# Patient Record
Sex: Female | Born: 1960 | Race: Black or African American | Hispanic: No | Smoking: Never smoker
Health system: Southern US, Community
[De-identification: ages and names within clinical notes are randomized; demographics above are authoritative.]

## PROBLEM LIST (undated history)

## (undated) DIAGNOSIS — I639 Cerebral infarction, unspecified: Secondary | ICD-10-CM

## (undated) DIAGNOSIS — M1990 Unspecified osteoarthritis, unspecified site: Secondary | ICD-10-CM

## (undated) DIAGNOSIS — M7120 Synovial cyst of popliteal space [Baker], unspecified knee: Secondary | ICD-10-CM

## (undated) DIAGNOSIS — E119 Type 2 diabetes mellitus without complications: Secondary | ICD-10-CM

## (undated) DIAGNOSIS — G253 Myoclonus: Secondary | ICD-10-CM

## (undated) DIAGNOSIS — G9050 Complex regional pain syndrome I, unspecified: Secondary | ICD-10-CM

## (undated) HISTORY — DX: Unspecified osteoarthritis, unspecified site: M19.90

## (undated) HISTORY — PX: FOOT SURGERY: SHX648

## (undated) HISTORY — PX: ANKLE SURGERY: SHX546

## (undated) HISTORY — DX: Type 2 diabetes mellitus without complications: E11.9

## (undated) HISTORY — DX: Myoclonus: G25.3

## (undated) HISTORY — DX: Synovial cyst of popliteal space (Baker), unspecified knee: M71.20

## (undated) HISTORY — PX: KNEE SURGERY: SHX244

---

## 2008-10-07 ENCOUNTER — Ambulatory Visit (HOSPITAL_COMMUNITY): Payer: Self-pay | Admitting: Psychiatry

## 2008-11-07 ENCOUNTER — Ambulatory Visit (HOSPITAL_COMMUNITY): Payer: Self-pay | Admitting: Psychiatry

## 2008-11-15 ENCOUNTER — Ambulatory Visit (HOSPITAL_COMMUNITY): Payer: Self-pay | Admitting: Psychiatry

## 2008-11-22 ENCOUNTER — Ambulatory Visit (HOSPITAL_COMMUNITY): Payer: Self-pay | Admitting: Psychiatry

## 2008-12-01 ENCOUNTER — Ambulatory Visit (HOSPITAL_COMMUNITY): Payer: Self-pay | Admitting: Psychiatry

## 2008-12-08 ENCOUNTER — Ambulatory Visit (HOSPITAL_COMMUNITY): Payer: Self-pay | Admitting: Psychiatry

## 2008-12-13 ENCOUNTER — Ambulatory Visit (HOSPITAL_COMMUNITY): Payer: Self-pay | Admitting: Psychiatry

## 2008-12-19 ENCOUNTER — Ambulatory Visit (HOSPITAL_COMMUNITY): Payer: Self-pay | Admitting: Psychiatry

## 2009-01-09 ENCOUNTER — Ambulatory Visit (HOSPITAL_COMMUNITY): Payer: Self-pay | Admitting: Psychiatry

## 2009-01-10 ENCOUNTER — Ambulatory Visit (HOSPITAL_COMMUNITY): Payer: Self-pay | Admitting: Psychiatry

## 2009-01-16 ENCOUNTER — Ambulatory Visit (HOSPITAL_COMMUNITY): Payer: Self-pay | Admitting: Psychiatry

## 2009-01-26 ENCOUNTER — Ambulatory Visit (HOSPITAL_COMMUNITY): Payer: Self-pay | Admitting: Psychiatry

## 2009-02-08 ENCOUNTER — Ambulatory Visit (HOSPITAL_COMMUNITY): Payer: Self-pay | Admitting: Psychiatry

## 2009-02-14 ENCOUNTER — Ambulatory Visit (HOSPITAL_COMMUNITY): Payer: Self-pay | Admitting: Psychiatry

## 2009-03-01 ENCOUNTER — Ambulatory Visit (HOSPITAL_COMMUNITY): Payer: Self-pay | Admitting: Psychiatry

## 2009-03-08 ENCOUNTER — Ambulatory Visit (HOSPITAL_COMMUNITY): Payer: Self-pay | Admitting: Psychiatry

## 2009-03-17 ENCOUNTER — Ambulatory Visit (HOSPITAL_COMMUNITY): Payer: Self-pay | Admitting: Psychiatry

## 2009-03-29 ENCOUNTER — Ambulatory Visit (HOSPITAL_COMMUNITY): Payer: Self-pay | Admitting: Psychiatry

## 2009-04-11 ENCOUNTER — Ambulatory Visit (HOSPITAL_COMMUNITY): Payer: Self-pay | Admitting: Psychiatry

## 2009-04-27 ENCOUNTER — Ambulatory Visit (HOSPITAL_COMMUNITY): Payer: Self-pay | Admitting: Psychiatry

## 2009-05-03 ENCOUNTER — Ambulatory Visit (HOSPITAL_COMMUNITY): Payer: Self-pay | Admitting: Psychiatry

## 2009-05-09 ENCOUNTER — Ambulatory Visit (HOSPITAL_COMMUNITY): Payer: Self-pay | Admitting: Psychiatry

## 2009-05-10 ENCOUNTER — Ambulatory Visit (HOSPITAL_COMMUNITY): Payer: Self-pay | Admitting: Psychiatry

## 2009-06-05 ENCOUNTER — Ambulatory Visit (HOSPITAL_COMMUNITY): Payer: Self-pay | Admitting: Psychiatry

## 2009-08-18 ENCOUNTER — Ambulatory Visit (HOSPITAL_COMMUNITY): Payer: Self-pay | Admitting: Licensed Clinical Social Worker

## 2009-08-28 ENCOUNTER — Encounter: Admission: RE | Admit: 2009-08-28 | Discharge: 2009-08-28 | Payer: Self-pay | Admitting: Internal Medicine

## 2009-08-31 ENCOUNTER — Ambulatory Visit (HOSPITAL_COMMUNITY): Payer: Self-pay | Admitting: Licensed Clinical Social Worker

## 2009-09-11 ENCOUNTER — Ambulatory Visit (HOSPITAL_COMMUNITY): Payer: Self-pay | Admitting: Psychiatry

## 2009-09-18 ENCOUNTER — Ambulatory Visit (HOSPITAL_COMMUNITY): Payer: Self-pay | Admitting: Licensed Clinical Social Worker

## 2009-10-24 ENCOUNTER — Ambulatory Visit (HOSPITAL_COMMUNITY): Payer: Self-pay | Admitting: Licensed Clinical Social Worker

## 2009-12-04 ENCOUNTER — Ambulatory Visit (HOSPITAL_COMMUNITY): Payer: Self-pay | Admitting: Psychiatry

## 2009-12-06 ENCOUNTER — Ambulatory Visit (HOSPITAL_COMMUNITY): Payer: Self-pay | Admitting: Licensed Clinical Social Worker

## 2009-12-26 ENCOUNTER — Ambulatory Visit (HOSPITAL_COMMUNITY): Payer: Self-pay | Admitting: Licensed Clinical Social Worker

## 2010-01-09 ENCOUNTER — Ambulatory Visit (HOSPITAL_COMMUNITY): Payer: Self-pay | Admitting: Licensed Clinical Social Worker

## 2010-01-26 ENCOUNTER — Ambulatory Visit (HOSPITAL_COMMUNITY): Payer: Self-pay | Admitting: Licensed Clinical Social Worker

## 2010-03-06 ENCOUNTER — Ambulatory Visit (HOSPITAL_COMMUNITY): Payer: Self-pay | Admitting: Psychiatry

## 2010-03-08 ENCOUNTER — Ambulatory Visit (HOSPITAL_COMMUNITY): Payer: Self-pay | Admitting: Licensed Clinical Social Worker

## 2010-03-20 ENCOUNTER — Ambulatory Visit (HOSPITAL_COMMUNITY): Payer: Self-pay | Admitting: Licensed Clinical Social Worker

## 2010-04-26 ENCOUNTER — Ambulatory Visit (HOSPITAL_COMMUNITY): Payer: Self-pay | Admitting: Licensed Clinical Social Worker

## 2010-05-08 ENCOUNTER — Ambulatory Visit (HOSPITAL_COMMUNITY): Payer: Self-pay | Admitting: Psychiatry

## 2010-05-10 ENCOUNTER — Ambulatory Visit (HOSPITAL_COMMUNITY): Payer: Self-pay | Admitting: Licensed Clinical Social Worker

## 2010-05-24 ENCOUNTER — Ambulatory Visit (HOSPITAL_COMMUNITY)
Admission: RE | Admit: 2010-05-24 | Discharge: 2010-05-24 | Payer: Self-pay | Source: Home / Self Care | Attending: Licensed Clinical Social Worker | Admitting: Licensed Clinical Social Worker

## 2010-06-07 ENCOUNTER — Ambulatory Visit (HOSPITAL_COMMUNITY): Admit: 2010-06-07 | Payer: Self-pay | Admitting: Licensed Clinical Social Worker

## 2010-07-10 ENCOUNTER — Encounter (HOSPITAL_COMMUNITY): Payer: Self-pay | Admitting: Psychiatry

## 2010-07-24 ENCOUNTER — Encounter (INDEPENDENT_AMBULATORY_CARE_PROVIDER_SITE_OTHER): Payer: Medicare Other | Admitting: Licensed Clinical Social Worker

## 2010-07-24 DIAGNOSIS — F331 Major depressive disorder, recurrent, moderate: Secondary | ICD-10-CM

## 2010-07-27 ENCOUNTER — Encounter (HOSPITAL_COMMUNITY): Payer: Self-pay | Admitting: Psychiatry

## 2010-08-03 ENCOUNTER — Encounter (INDEPENDENT_AMBULATORY_CARE_PROVIDER_SITE_OTHER): Payer: Medicare Other | Admitting: Psychiatry

## 2010-08-03 DIAGNOSIS — F339 Major depressive disorder, recurrent, unspecified: Secondary | ICD-10-CM

## 2010-08-08 ENCOUNTER — Other Ambulatory Visit: Payer: Self-pay | Admitting: Internal Medicine

## 2010-08-08 DIAGNOSIS — Z1231 Encounter for screening mammogram for malignant neoplasm of breast: Secondary | ICD-10-CM

## 2010-08-22 ENCOUNTER — Encounter (HOSPITAL_COMMUNITY): Payer: Medicare Other | Admitting: Licensed Clinical Social Worker

## 2010-09-04 ENCOUNTER — Ambulatory Visit
Admission: RE | Admit: 2010-09-04 | Discharge: 2010-09-04 | Disposition: A | Discharge status: Home / Self Care | Payer: Medicare Other | Source: Ambulatory Visit | Attending: Internal Medicine | Admitting: Internal Medicine

## 2010-09-04 DIAGNOSIS — Z1231 Encounter for screening mammogram for malignant neoplasm of breast: Secondary | ICD-10-CM

## 2010-09-12 ENCOUNTER — Encounter (INDEPENDENT_AMBULATORY_CARE_PROVIDER_SITE_OTHER): Payer: Medicare Other | Admitting: Licensed Clinical Social Worker

## 2010-09-12 DIAGNOSIS — F331 Major depressive disorder, recurrent, moderate: Secondary | ICD-10-CM

## 2010-09-26 ENCOUNTER — Encounter (HOSPITAL_COMMUNITY): Payer: Medicare Other | Admitting: Licensed Clinical Social Worker

## 2010-11-05 ENCOUNTER — Encounter (HOSPITAL_COMMUNITY): Payer: Medicare Other | Admitting: Psychiatry

## 2010-12-05 ENCOUNTER — Encounter (INDEPENDENT_AMBULATORY_CARE_PROVIDER_SITE_OTHER): Payer: Medicare Other | Admitting: Licensed Clinical Social Worker

## 2010-12-05 DIAGNOSIS — F331 Major depressive disorder, recurrent, moderate: Secondary | ICD-10-CM

## 2010-12-06 ENCOUNTER — Encounter (HOSPITAL_COMMUNITY): Payer: Medicare Other | Admitting: Psychiatry

## 2011-01-02 ENCOUNTER — Encounter (HOSPITAL_COMMUNITY): Payer: Medicare Other | Admitting: Psychiatry

## 2011-05-06 ENCOUNTER — Other Ambulatory Visit (HOSPITAL_COMMUNITY): Payer: Self-pay | Admitting: Psychiatry

## 2011-05-06 MED ORDER — ZOLPIDEM TARTRATE 10 MG PO TABS: 10.0000 mg | ORAL_TABLET | Freq: Every evening | ORAL | Status: DC | PRN

## 2011-07-02 ENCOUNTER — Other Ambulatory Visit (HOSPITAL_COMMUNITY): Payer: Self-pay | Admitting: Psychiatry

## 2011-07-02 MED ORDER — CLONAZEPAM 1 MG PO TABS: 1.0000 mg | ORAL_TABLET | Freq: Two times a day (BID) | ORAL | Status: DC | PRN

## 2011-07-08 ENCOUNTER — Telehealth (HOSPITAL_COMMUNITY): Payer: Self-pay

## 2011-07-08 NOTE — Telephone Encounter (Signed)
PT WOULD LIKE TO TOUCH BASE WITH YOU ABOUT WHATS BEEN GOING ON

## 2011-09-04 ENCOUNTER — Telehealth (HOSPITAL_COMMUNITY): Payer: Self-pay | Admitting: Licensed Clinical Social Worker

## 2011-09-11 NOTE — Telephone Encounter (Signed)
Tried to reach Alexy again no answer - left message.

## 2011-10-17 ENCOUNTER — Telehealth (HOSPITAL_COMMUNITY): Payer: Self-pay

## 2011-10-17 NOTE — Telephone Encounter (Signed)
ok 

## 2011-10-19 DIAGNOSIS — I639 Cerebral infarction, unspecified: Secondary | ICD-10-CM

## 2011-10-19 HISTORY — DX: Cerebral infarction, unspecified: I63.9

## 2011-11-03 ENCOUNTER — Emergency Department: Admission: EM | Admit: 2011-11-03 | Discharge: 2011-11-03 | Payer: Medicare Other | Source: Home / Self Care

## 2012-01-10 NOTE — Telephone Encounter (Signed)
Have not been able to reach - close for now

## 2012-02-04 ENCOUNTER — Ambulatory Visit (INDEPENDENT_AMBULATORY_CARE_PROVIDER_SITE_OTHER): Payer: Medicare Other | Admitting: Licensed Clinical Social Worker

## 2012-02-04 ENCOUNTER — Telehealth (HOSPITAL_COMMUNITY): Payer: Self-pay

## 2012-02-04 DIAGNOSIS — F332 Major depressive disorder, recurrent severe without psychotic features: Secondary | ICD-10-CM
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
## 2012-02-04 NOTE — Telephone Encounter (Signed)
[facility] [phone] PT WAS ADMITTED. SIGNED IN VOLUNTARILY. JUST FYI BUT CONSENT IS SIGNED IF YOU NEED TO TALK TO DR. Betti Cruz

## 2012-02-07 DIAGNOSIS — F332 Major depressive disorder, recurrent severe without psychotic features: Secondary | ICD-10-CM | POA: Insufficient documentation

## 2012-02-09 ENCOUNTER — Emergency Department (INDEPENDENT_AMBULATORY_CARE_PROVIDER_SITE_OTHER)
Admission: EM | Admit: 2012-02-09 | Discharge: 2012-02-09 | Disposition: A | Discharge status: Home / Self Care | Payer: Medicare Other | Source: Home / Self Care | Attending: Family Medicine | Admitting: Family Medicine

## 2012-02-09 DIAGNOSIS — B9689 Other specified bacterial agents as the cause of diseases classified elsewhere: Secondary | ICD-10-CM

## 2012-02-09 DIAGNOSIS — Z23 Encounter for immunization: Secondary | ICD-10-CM

## 2012-02-09 DIAGNOSIS — N760 Acute vaginitis: Secondary | ICD-10-CM

## 2012-02-09 DIAGNOSIS — A499 Bacterial infection, unspecified: Secondary | ICD-10-CM

## 2012-02-09 HISTORY — DX: Complex regional pain syndrome I, unspecified: G90.50

## 2012-02-09 HISTORY — DX: Cerebral infarction, unspecified: I63.9

## 2012-02-09 LAB — HIV ANTIBODY (ROUTINE TESTING W REFLEX): HIV: NONREACTIVE

## 2012-02-09 MED ORDER — METRONIDAZOLE 0.75 % VA GEL: VAGINAL | Status: DC

## 2012-02-09 MED ORDER — INFLUENZA VAC TYP A&B SURF ANT IM INJ
0.5000 mL | INJECTION | INTRAMUSCULAR | Status: AC
  Administered 2012-02-09: 0.5 mL via INTRAMUSCULAR

## 2012-02-09 NOTE — ED Provider Notes (Signed)
History     CSN: [id number]  Arrival date & time 02/09/12  1421   First MD Initiated Contact with Patient 02/09/12 1515      Chief Complaint  Patient presents with  . Exposure to STD      HPI Comments: Pt states she received a letter from woman who stated that she had contracted herpes for her partner; she is here to be evaluated for STD's.  She states that she has some vaginal discharge but feels well otherwise  Patient is a 51 y.o. female presenting with STD exposure. The history is provided by the patient.  Exposure to STD This is a new problem. Episode onset: unknown. Associated symptoms comments: none.    Past Medical History  Diagnosis Date  . Stroke   . RSD (reflex sympathetic dystrophy)     Past Surgical History  Procedure Date  . Foot surgery   . Knee surgery   . Ankle surgery     Family History  Problem Relation Age of Onset  . Cancer Mother     History  Substance Use Topics  . Smoking status: Never Smoker   . Smokeless tobacco: Not on file  . Alcohol Use: No    OB History    Grav Para Term Preterm Abortions TAB SAB Ect Mult Living                  Review of Systems  All other systems reviewed and are negative.    Allergies  Topamax  Home Medications   Current Outpatient Rx  Name Route Sig Dispense Refill  . ASPIRIN 81 MG PO TABS Oral Take 81 mg by mouth daily.    Marland Kitchen CLOPIDOGREL BISULFATE 300 MG PO TABS Oral Take 300 mg by mouth once.    Marland Kitchen HYDROMORPHONE HCL 2 MG PO TABS Oral Take 2 mg by mouth every 4 (four) hours as needed.    Marland Kitchen ZOLPIDEM TARTRATE 5 MG PO TABS Oral Take 5 mg by mouth at bedtime as needed.    Marland Kitchen CLONAZEPAM 1 MG PO TABS Oral Take 1 tablet (1 mg total) by mouth 2 (two) times daily as needed for anxiety. 30 tablet 1  . METRONIDAZOLE 0.75 % VA GEL  Place one applicatorful in vagina once daily for five days 70 g 0  . ZOLPIDEM TARTRATE 10 MG PO TABS Oral Take 1 tablet (10 mg total) by mouth at bedtime as needed for sleep. 15  tablet 2    BP 143/84  Pulse 67  Temp 98 F (36.7 C) (Oral)  Resp 16  Ht 5\' 3"  (1.6 m)  Wt 164 lb (74.39 kg)  BMI 29.05 kg/m2  SpO2 97%  Physical Exam Nursing notes and Vital Signs reviewed. Appearance:  Patient appears stated age, and in no acute distress Eyes:  Pupils are equal, round, and reactive to light and accomodation.  Extraocular movement is intact.  Conjunctivae are not inflamed  Pharynx:  Mouth/pharynx:  No lesions Neck:  Supple.  No adenopathy Lungs:  Clear to auscultation.  Breath sounds are equal.  Heart:  Regular rate and rhythm without murmurs, rubs, or gallops.  Abdomen:  Nontender without masses or hepatosplenomegaly.  Bowel sounds are present.  No CVA or flank tenderness.  Extremities:  No edema.  No calf tenderness Skin:  No rash present.  Genitourinary:  Vulva appears normal without lesions or erythema.  Vagina has normal mucosae without lesions.  There is white discharge in the vaginal vault.  Cervix and  uterus are absent.   Adnexae are non-tender without masses.   ED Course  Procedures none   Labs Reviewed  HIV ANTIBODY (ROUTINE TESTING)  RPR  GC/CHLAMYDIA PROBE AMP, GENITAL  HSV(HERPES SMPLX)ABS-I+II(IGG+IGM)-BLD  POCT WET + KOH PREP (DR PERFORMED @ [facility]) many clue cells; otherwise negative      1. Bacterial vaginosis       MDM  Begin Metrogel Vaginal. HIV, RPR, HSV, GC/chlam, pending Followup with Family Doctor if not improved in one week.         Lattie Haw, MD 02/14/12 1018

## 2012-02-09 NOTE — ED Notes (Signed)
Pt states she received a letter from woman who state she had contracted herpes for her partner, she is her to be evaluated for STD's

## 2012-02-10 LAB — GC/CHLAMYDIA PROBE AMP, GENITAL: Chlamydia, DNA Probe: NEGATIVE

## 2012-02-11 ENCOUNTER — Telehealth: Payer: Self-pay | Admitting: *Deleted

## 2012-03-03 ENCOUNTER — Ambulatory Visit (INDEPENDENT_AMBULATORY_CARE_PROVIDER_SITE_OTHER): Payer: Medicare Other | Admitting: Licensed Clinical Social Worker

## 2012-03-03 DIAGNOSIS — F332 Major depressive disorder, recurrent severe without psychotic features: Secondary | ICD-10-CM
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
## 2012-03-10 ENCOUNTER — Encounter (HOSPITAL_COMMUNITY): Payer: Self-pay | Admitting: Psychiatry

## 2012-03-10 ENCOUNTER — Ambulatory Visit (INDEPENDENT_AMBULATORY_CARE_PROVIDER_SITE_OTHER): Payer: Medicare Other | Admitting: Psychiatry

## 2012-03-10 VITALS — BP 109/71 | HR 63 | Ht 63.0 in | Wt 166.0 lb

## 2012-03-10 DIAGNOSIS — F332 Major depressive disorder, recurrent severe without psychotic features: Secondary | ICD-10-CM

## 2012-03-10 MED ORDER — MIRTAZAPINE 15 MG PO TABS: 15.0000 mg | ORAL_TABLET | Freq: Every day | ORAL | Status: DC

## 2012-03-10 MED ORDER — CITALOPRAM HYDROBROMIDE 10 MG PO TABS: 30.0000 mg | ORAL_TABLET | Freq: Every day | ORAL | Status: DC

## 2012-03-10 MED ORDER — CLONAZEPAM 0.5 MG PO TABS: 0.5000 mg | ORAL_TABLET | Freq: Two times a day (BID) | ORAL | Status: DC | PRN
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
## 2012-03-17 ENCOUNTER — Ambulatory Visit (HOSPITAL_COMMUNITY): Payer: Medicare Other | Admitting: Licensed Clinical Social Worker

## 2012-03-20 DIAGNOSIS — M7120 Synovial cyst of popliteal space [Baker], unspecified knee: Secondary | ICD-10-CM

## 2012-03-20 HISTORY — DX: Synovial cyst of popliteal space (Baker), unspecified knee: M71.20

## 2012-03-25 ENCOUNTER — Ambulatory Visit (HOSPITAL_COMMUNITY): Payer: Self-pay | Admitting: Licensed Clinical Social Worker

## 2012-04-02 ENCOUNTER — Ambulatory Visit (HOSPITAL_COMMUNITY): Payer: Self-pay | Admitting: Licensed Clinical Social Worker

## 2012-04-07 ENCOUNTER — Encounter (HOSPITAL_COMMUNITY): Payer: Self-pay | Admitting: Psychiatry

## 2012-04-07 ENCOUNTER — Ambulatory Visit (INDEPENDENT_AMBULATORY_CARE_PROVIDER_SITE_OTHER): Payer: Medicare Other | Admitting: Psychiatry

## 2012-04-07 VITALS — BP 125/94 | HR 59 | Ht 63.0 in | Wt 162.0 lb

## 2012-04-07 DIAGNOSIS — F332 Major depressive disorder, recurrent severe without psychotic features: Secondary | ICD-10-CM

## 2012-04-07 MED ORDER — CITALOPRAM HYDROBROMIDE 40 MG PO TABS: 40.0000 mg | ORAL_TABLET | Freq: Every day | ORAL | Status: DC

## 2012-04-07 MED ORDER — CLONAZEPAM 0.5 MG PO TABS: 0.2500 mg | ORAL_TABLET | Freq: Two times a day (BID) | ORAL | Status: DC | PRN

## 2012-04-07 MED ORDER — MIRTAZAPINE 15 MG PO TABS: 15.0000 mg | ORAL_TABLET | Freq: Every day | ORAL | Status: DC
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
## 2012-04-14 ENCOUNTER — Ambulatory Visit (HOSPITAL_COMMUNITY): Payer: Self-pay | Admitting: Licensed Clinical Social Worker

## 2012-05-06 ENCOUNTER — Ambulatory Visit (HOSPITAL_COMMUNITY): Payer: Self-pay | Admitting: Psychiatry

## 2012-06-03 ENCOUNTER — Encounter (HOSPITAL_COMMUNITY): Payer: Self-pay | Admitting: Psychiatry

## 2012-06-03 ENCOUNTER — Ambulatory Visit (INDEPENDENT_AMBULATORY_CARE_PROVIDER_SITE_OTHER): Payer: Medicare Other | Admitting: Psychiatry

## 2012-06-03 VITALS — BP 128/80 | HR 65 | Ht 63.0 in | Wt 165.0 lb

## 2012-06-03 DIAGNOSIS — F332 Major depressive disorder, recurrent severe without psychotic features: Secondary | ICD-10-CM

## 2012-06-03 MED ORDER — MIRTAZAPINE 30 MG PO TABS: 30.0000 mg | ORAL_TABLET | Freq: Every day | ORAL | Status: DC

## 2012-06-03 MED ORDER — CITALOPRAM HYDROBROMIDE 40 MG PO TABS: 40.0000 mg | ORAL_TABLET | Freq: Every day | ORAL | Status: DC
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
## 2012-06-07 ENCOUNTER — Other Ambulatory Visit (HOSPITAL_COMMUNITY): Payer: Self-pay | Admitting: Psychiatry

## 2012-06-23 ENCOUNTER — Telehealth (HOSPITAL_COMMUNITY): Payer: Self-pay | Admitting: Psychiatry

## 2012-06-25 ENCOUNTER — Other Ambulatory Visit (HOSPITAL_COMMUNITY): Payer: Self-pay

## 2012-06-25 NOTE — Telephone Encounter (Signed)
Patient called requesting refill for Ambien.  Patient has filled Citalopram on 06/03/2013, and Mirtazapine on 06/04/2011.  Patient reports that she is also on She denies any Suicidal ideation, or homicidal ideation.  She reports that she stays in bed all day.    PLAN: Asked patient to reduce mirtazapine to 15 mg. No further zolpidem given her current prescriptions.

## 2012-06-26 NOTE — Telephone Encounter (Signed)
Called patient back. Discussed plan with her.

## 2012-07-01 ENCOUNTER — Ambulatory Visit (HOSPITAL_COMMUNITY): Payer: Self-pay | Admitting: Psychiatry

## 2012-07-22 ENCOUNTER — Telehealth (HOSPITAL_COMMUNITY): Payer: Self-pay

## 2012-07-22 DIAGNOSIS — F332 Major depressive disorder, recurrent severe without psychotic features: Secondary | ICD-10-CM
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
## 2012-07-22 NOTE — Telephone Encounter (Signed)
Hasn't seen change with medication

## 2012-07-30 ENCOUNTER — Other Ambulatory Visit (HOSPITAL_COMMUNITY): Payer: Self-pay | Admitting: Psychiatry

## 2012-08-03 ENCOUNTER — Telehealth (HOSPITAL_COMMUNITY): Payer: Self-pay

## 2012-08-03 DIAGNOSIS — F332 Major depressive disorder, recurrent severe without psychotic features: Secondary | ICD-10-CM

## 2012-08-03 MED ORDER — MIRTAZAPINE 15 MG PO TABS: 15.0000 mg | ORAL_TABLET | Freq: Every day | ORAL | Status: DC

## 2012-08-03 MED ORDER — MIRTAZAPINE 30 MG PO TABS: 30.0000 mg | ORAL_TABLET | Freq: Every day | ORAL | Status: DC

## 2012-08-03 NOTE — Telephone Encounter (Signed)
Pharmacy request for medications.  PLAN: Will fill the 30 day supply with one refill for  Mirtazapine, and Celexa.

## 2012-08-04 ENCOUNTER — Other Ambulatory Visit (HOSPITAL_COMMUNITY): Payer: Self-pay | Admitting: Psychiatry

## 2012-08-04 DIAGNOSIS — F332 Major depressive disorder, recurrent severe without psychotic features: Secondary | ICD-10-CM

## 2012-08-04 MED ORDER — CITALOPRAM HYDROBROMIDE 40 MG PO TABS: 40.0000 mg | ORAL_TABLET | Freq: Every day | ORAL | Status: DC

## 2012-08-04 NOTE — Telephone Encounter (Addendum)
Called patient. Sent in prescription for Celexa. Denied prescription for Ambien. Asked patient not to use Ambien.

## 2012-08-04 NOTE — Addendum Note (Signed)
Addended by: Larena Sox on: 08/04/2012 05:20 PM   Modules accepted: Orders

## 2012-08-18 ENCOUNTER — Telehealth (HOSPITAL_COMMUNITY): Payer: Self-pay

## 2012-08-18 NOTE — Telephone Encounter (Signed)
Patient called she continues to have anxiety. She reports she has a therapist two days a weeks, but will not even dressed days that the therapist does not come. She will have the therapist for 6 weeks. She is convinced that she would benefit from Anxiety medications, but was isolating herself despite being on clonazepam.

## 2012-08-24 ENCOUNTER — Ambulatory Visit (HOSPITAL_COMMUNITY): Payer: Self-pay | Admitting: Psychiatry

## 2012-08-25 ENCOUNTER — Ambulatory Visit (INDEPENDENT_AMBULATORY_CARE_PROVIDER_SITE_OTHER): Payer: 59 | Admitting: Psychiatry

## 2012-08-25 ENCOUNTER — Telehealth (HOSPITAL_COMMUNITY): Payer: Self-pay

## 2012-08-25 ENCOUNTER — Encounter (HOSPITAL_COMMUNITY): Payer: Self-pay | Admitting: Psychiatry

## 2012-08-25 VITALS — BP 132/80 | HR 67 | Ht 63.0 in | Wt 172.0 lb

## 2012-08-25 DIAGNOSIS — F332 Major depressive disorder, recurrent severe without psychotic features: Secondary | ICD-10-CM

## 2012-08-25 MED ORDER — SERTRALINE HCL 25 MG PO TABS: ORAL_TABLET | ORAL | Status: DC

## 2012-08-25 MED ORDER — CITALOPRAM HYDROBROMIDE 10 MG PO TABS: ORAL_TABLET | ORAL | Status: DC

## 2012-08-25 NOTE — Telephone Encounter (Signed)
Patient called and asked questions. Answered.
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
## 2012-09-02 ENCOUNTER — Telehealth (HOSPITAL_COMMUNITY): Payer: Self-pay

## 2012-09-02 NOTE — Telephone Encounter (Signed)
WOULD LIKE TO SPEAK TO YOU ABOUT TALKING TO HER HEALTH NURSE. PLEASE CALL PATIENT. ALSO BELOW IS THE NURSES NAME AND NUBMER  FERNANDO [phone]

## 2012-09-02 NOTE — Telephone Encounter (Signed)
Called patient's home therapist. Not able to reach him. Left a message.  Called patient and asked patient to tell her home health nurse I could call tomorrow.

## 2012-09-03 ENCOUNTER — Telehealth (HOSPITAL_COMMUNITY): Payer: Self-pay | Admitting: Psychiatry

## 2012-09-03 NOTE — Telephone Encounter (Signed)
Attempted to call patient's therapist again. Went to [employer]

## 2012-09-03 NOTE — Telephone Encounter (Signed)
Pt therapist returned call. Please call today

## 2012-09-03 NOTE — Telephone Encounter (Signed)
Unable to reach patient's therapist by phone. Asked patient's therapist to call me.

## 2012-09-10 ENCOUNTER — Other Ambulatory Visit (HOSPITAL_COMMUNITY): Payer: Self-pay | Admitting: Psychiatry

## 2012-09-10 ENCOUNTER — Telehealth (HOSPITAL_COMMUNITY): Payer: Self-pay

## 2012-09-10 NOTE — Telephone Encounter (Signed)
Acknowledged.

## 2012-09-16 ENCOUNTER — Telehealth (HOSPITAL_COMMUNITY): Payer: Self-pay | Admitting: Psychiatry

## 2012-09-16 NOTE — Telephone Encounter (Signed)
Patient's nurse is working with a Child psychotherapist to get back into a day treatment program.  He reports her social is working on her Medicare application for transportation for the treatment program.

## 2012-09-22 ENCOUNTER — Ambulatory Visit (INDEPENDENT_AMBULATORY_CARE_PROVIDER_SITE_OTHER): Payer: 59 | Admitting: Psychiatry

## 2012-09-22 ENCOUNTER — Encounter (HOSPITAL_COMMUNITY): Payer: Self-pay | Admitting: Psychiatry

## 2012-09-22 VITALS — BP 127/76 | HR 68 | Ht 63.0 in | Wt 171.0 lb

## 2012-09-22 DIAGNOSIS — F329 Major depressive disorder, single episode, unspecified: Secondary | ICD-10-CM

## 2012-09-22 DIAGNOSIS — F332 Major depressive disorder, recurrent severe without psychotic features: Secondary | ICD-10-CM

## 2012-09-22 MED ORDER — SERTRALINE HCL 25 MG PO TABS: 25.0000 mg | ORAL_TABLET | Freq: Every day | ORAL | Status: DC

## 2012-09-22 MED ORDER — SERTRALINE HCL 100 MG PO TABS: 100.0000 mg | ORAL_TABLET | Freq: Every day | ORAL | Status: DC

## 2012-09-22 MED ORDER — MIRTAZAPINE 30 MG PO TABS: 30.0000 mg | ORAL_TABLET | Freq: Every day | ORAL | Status: DC
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
## 2012-09-24 ENCOUNTER — Ambulatory Visit (HOSPITAL_COMMUNITY): Payer: Self-pay | Admitting: Psychiatry

## 2012-10-20 ENCOUNTER — Encounter (HOSPITAL_COMMUNITY): Payer: Self-pay | Admitting: Psychiatry

## 2012-10-20 ENCOUNTER — Ambulatory Visit (INDEPENDENT_AMBULATORY_CARE_PROVIDER_SITE_OTHER): Payer: 59 | Admitting: Psychiatry

## 2012-10-20 VITALS — BP 115/78 | HR 63 | Ht 63.0 in | Wt 183.0 lb

## 2012-10-20 DIAGNOSIS — F332 Major depressive disorder, recurrent severe without psychotic features: Secondary | ICD-10-CM

## 2012-10-20 DIAGNOSIS — F329 Major depressive disorder, single episode, unspecified: Secondary | ICD-10-CM

## 2012-10-20 MED ORDER — SERTRALINE HCL 100 MG PO TABS: 150.0000 mg | ORAL_TABLET | Freq: Every day | ORAL | Status: DC
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
## 2012-11-23 ENCOUNTER — Encounter (HOSPITAL_COMMUNITY): Payer: Self-pay | Admitting: Psychiatry

## 2012-11-23 ENCOUNTER — Ambulatory Visit (INDEPENDENT_AMBULATORY_CARE_PROVIDER_SITE_OTHER): Payer: 59 | Admitting: Psychiatry

## 2012-11-23 ENCOUNTER — Other Ambulatory Visit: Payer: Self-pay | Admitting: Family Medicine

## 2012-11-23 VITALS — BP 110/78 | HR 75 | Ht 63.0 in | Wt 185.5 lb

## 2012-11-23 DIAGNOSIS — F332 Major depressive disorder, recurrent severe without psychotic features: Secondary | ICD-10-CM

## 2012-11-23 DIAGNOSIS — Z1231 Encounter for screening mammogram for malignant neoplasm of breast: Secondary | ICD-10-CM

## 2012-11-23 DIAGNOSIS — F329 Major depressive disorder, single episode, unspecified: Secondary | ICD-10-CM

## 2012-11-23 MED ORDER — SERTRALINE HCL 100 MG PO TABS: 200.0000 mg | ORAL_TABLET | Freq: Every day | ORAL | Status: DC

## 2012-11-23 MED ORDER — MIRTAZAPINE 30 MG PO TABS: 30.0000 mg | ORAL_TABLET | Freq: Every day | ORAL | Status: DC
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
## 2012-12-22 ENCOUNTER — Ambulatory Visit: Payer: Self-pay

## 2013-01-04 ENCOUNTER — Ambulatory Visit (HOSPITAL_COMMUNITY): Payer: Self-pay | Admitting: Psychiatry

## 2013-01-19 ENCOUNTER — Telehealth (HOSPITAL_COMMUNITY): Payer: Self-pay

## 2013-01-19 ENCOUNTER — Other Ambulatory Visit (HOSPITAL_COMMUNITY): Payer: Self-pay | Admitting: Psychiatry

## 2013-01-19 DIAGNOSIS — F329 Major depressive disorder, single episode, unspecified: Secondary | ICD-10-CM

## 2013-01-19 MED ORDER — SERTRALINE HCL 100 MG PO TABS: 250.0000 mg | ORAL_TABLET | Freq: Every day | ORAL | Status: DC

## 2013-01-19 NOTE — Telephone Encounter (Signed)
The patient reports that her father has Stage 4 bone cancer.  She reports that she has stopped mirtazapine as it was not helping. She continues to having.   PLAN: Increase sertraline to 250 mg daily. No further increase until she is seen by a provider. Advised her about serotonin syndrome and to stop medication and go to ER if this happens.

## 2013-02-24 ENCOUNTER — Ambulatory Visit (INDEPENDENT_AMBULATORY_CARE_PROVIDER_SITE_OTHER): Payer: 59 | Admitting: Psychiatry

## 2013-02-24 ENCOUNTER — Encounter (HOSPITAL_COMMUNITY): Payer: Self-pay | Admitting: Psychiatry

## 2013-02-24 ENCOUNTER — Encounter (INDEPENDENT_AMBULATORY_CARE_PROVIDER_SITE_OTHER): Payer: Self-pay

## 2013-02-24 VITALS — BP 104/71 | HR 65 | Ht 63.0 in | Wt 182.0 lb

## 2013-02-24 DIAGNOSIS — F332 Major depressive disorder, recurrent severe without psychotic features: Secondary | ICD-10-CM
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
## 2013-03-29 ENCOUNTER — Ambulatory Visit (HOSPITAL_COMMUNITY): Payer: Self-pay | Admitting: Psychiatry

## 2013-04-10 ENCOUNTER — Other Ambulatory Visit (HOSPITAL_COMMUNITY): Payer: Self-pay | Admitting: Psychiatry

## 2013-04-21 NOTE — Telephone Encounter (Signed)
Refilled sertraline

## 2013-05-24 ENCOUNTER — Ambulatory Visit (INDEPENDENT_AMBULATORY_CARE_PROVIDER_SITE_OTHER): Payer: Medicare Other | Admitting: Psychiatry

## 2013-05-24 ENCOUNTER — Encounter (HOSPITAL_COMMUNITY): Payer: Self-pay | Admitting: Psychiatry

## 2013-05-24 ENCOUNTER — Other Ambulatory Visit: Payer: Self-pay | Admitting: Internal Medicine

## 2013-05-24 ENCOUNTER — Encounter (INDEPENDENT_AMBULATORY_CARE_PROVIDER_SITE_OTHER): Payer: Self-pay

## 2013-05-24 VITALS — BP 107/71 | HR 59 | Wt 188.0 lb

## 2013-05-24 DIAGNOSIS — F332 Major depressive disorder, recurrent severe without psychotic features: Secondary | ICD-10-CM

## 2013-05-24 DIAGNOSIS — Z1231 Encounter for screening mammogram for malignant neoplasm of breast: Secondary | ICD-10-CM
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
## 2013-05-25 ENCOUNTER — Ambulatory Visit (INDEPENDENT_AMBULATORY_CARE_PROVIDER_SITE_OTHER): Payer: Medicare Other

## 2013-05-25 DIAGNOSIS — Z1231 Encounter for screening mammogram for malignant neoplasm of breast: Secondary | ICD-10-CM

## 2013-05-26 ENCOUNTER — Encounter (HOSPITAL_COMMUNITY): Payer: Self-pay | Admitting: Psychiatry

## 2013-06-28 ENCOUNTER — Encounter (HOSPITAL_COMMUNITY): Payer: Self-pay | Admitting: Psychiatry

## 2013-06-28 ENCOUNTER — Encounter (INDEPENDENT_AMBULATORY_CARE_PROVIDER_SITE_OTHER): Payer: Self-pay

## 2013-06-28 ENCOUNTER — Ambulatory Visit (INDEPENDENT_AMBULATORY_CARE_PROVIDER_SITE_OTHER): Payer: Medicare Other | Admitting: Psychiatry

## 2013-06-28 VITALS — BP 118/86 | HR 84 | Wt 182.0 lb

## 2013-06-28 DIAGNOSIS — F332 Major depressive disorder, recurrent severe without psychotic features: Secondary | ICD-10-CM

## 2013-06-28 MED ORDER — SERTRALINE HCL 100 MG PO TABS: ORAL_TABLET | ORAL | Status: DC
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
## 2013-08-10 ENCOUNTER — Ambulatory Visit (HOSPITAL_COMMUNITY): Payer: Self-pay | Admitting: Psychiatry

## 2013-08-12 ENCOUNTER — Encounter (HOSPITAL_COMMUNITY): Payer: Self-pay | Admitting: Psychiatry

## 2013-08-12 ENCOUNTER — Encounter (INDEPENDENT_AMBULATORY_CARE_PROVIDER_SITE_OTHER): Payer: Self-pay

## 2013-08-12 ENCOUNTER — Ambulatory Visit (INDEPENDENT_AMBULATORY_CARE_PROVIDER_SITE_OTHER): Payer: Medicare Other | Admitting: Psychiatry

## 2013-08-12 VITALS — BP 116/86 | HR 66 | Wt 183.0 lb

## 2013-08-12 DIAGNOSIS — F332 Major depressive disorder, recurrent severe without psychotic features: Secondary | ICD-10-CM

## 2013-08-12 MED ORDER — SERTRALINE HCL 100 MG PO TABS: ORAL_TABLET | ORAL | Status: AC
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
## 2019-01-29 ENCOUNTER — Other Ambulatory Visit: Payer: Self-pay

## 2019-01-29 ENCOUNTER — Emergency Department (INDEPENDENT_AMBULATORY_CARE_PROVIDER_SITE_OTHER)
Admission: EM | Admit: 2019-01-29 | Discharge: 2019-01-29 | Disposition: A | Discharge status: Home / Self Care | Payer: Medicare Other | Source: Home / Self Care | Attending: Family Medicine | Admitting: Family Medicine

## 2019-01-29 ENCOUNTER — Emergency Department (INDEPENDENT_AMBULATORY_CARE_PROVIDER_SITE_OTHER): Payer: Medicare Other

## 2019-01-29 ENCOUNTER — Encounter: Payer: Self-pay | Admitting: Emergency Medicine

## 2019-01-29 DIAGNOSIS — R05 Cough: Secondary | ICD-10-CM

## 2019-01-29 DIAGNOSIS — J40 Bronchitis, not specified as acute or chronic: Secondary | ICD-10-CM

## 2019-01-29 MED ORDER — DOXYCYCLINE HYCLATE 100 MG PO CAPS: 100.0000 mg | ORAL_CAPSULE | Freq: Two times a day (BID) | ORAL | 0 refills | Status: AC

## 2019-01-29 MED ORDER — BENZONATATE 200 MG PO CAPS: ORAL_CAPSULE | ORAL | 0 refills | Status: AC

## 2019-01-29 NOTE — ED Triage Notes (Signed)
Pt states she had an infusion done for arthritis on 8-27, since then she has been having cough and congestion but states it is getting worse. Productive cough with sputum that is worse at night.

## 2019-01-29 NOTE — ED Provider Notes (Signed)
[facility]    CSN: [id number] Arrival date & time: 01/29/19  1324      History   Chief Complaint Chief Complaint  Patient presents with  . Cough    HPI Nichole Parker is a 58 y.o. female.   Patient has RA and had infusion of infliximab-dyyb on 01/14/19.  Afterwards she felt fatigued with myalgias.  Three days ago she developed a partly productive cough and congestion with chills/sweats.  She has tightness in her anterior chest and shortness of breath with activity, but no pleuritic pain.  She denies changes in taste/smell and states that she had a negative COVID19 test 4 weeks ago.  The history is provided by the patient.    Past Medical History:  Diagnosis Date  . Arthritis    Bilateral knees.  . Baker's cyst of knee 03/2012   Right Knee  . Diabetes mellitus, type II (HCC)   . 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 RSD (reflex sympathetic dystrophy)   . Stroke (HCC) 10/2011   Had several strokes.  . Stroke [facility](HCC) 04/2012    Patient Active Problem List   Diagnosis Date Noted  . Major depressive disorder, recurrent episode, severe (HCC) 02/07/2012    Past Surgical History:  Procedure Laterality Date  . ANKLE SURGERY    . FOOT SURGERY    . KNEE SURGERY      OB History   No obstetric history on file.      Home Medications    Prior to Admission medications   Medication Sig Start Date End Date Taking? Authorizing Provider  aspirin 81 MG tablet Take 81 mg by mouth daily.    [provider]  baclofen (LIORESAL) 10 MG tablet Take 20 mg by mouth 4 (four) times daily.     [provider]  benzonatate (TESSALON) 200 MG capsule Take one cap by mouth at bedtime as needed for cough.  May repeat in 4 to 6 hours 01/29/19   Lattie HawBeese, Kimarion Chery A, MD  clonazePAM (KLONOPIN) 0.5 MG tablet Take 0.5 mg by mouth 2 (two) times daily as needed.    Aggie CosierPinyan, Clark, MD  clopidogrel (PLAVIX) 75 MG tablet Take 75 mg by mouth daily.    [provider]  doxycycline  (VIBRAMYCIN) 100 MG capsule Take 1 capsule (100 mg total) by mouth 2 (two) times daily. Take with food. 01/29/19   Lattie HawBeese, Oryan Winterton A, MD  fentaNYL (DURAGESIC - DOSED MCG/HR) 75 MCG/HR Place 1 patch onto the skin every 3 (three) days.    Burtis JunesGarvin, Tarsha, MD  gabapentin (NEURONTIN) 800 MG tablet Take 800 mg by mouth 4 (four) times daily.     [provider]  HYDROmorphone (DILAUDID) 2 MG tablet Take 8 mg by mouth every 4 (four) hours as needed.     [provider]  rosuvastatin (CRESTOR) 10 MG tablet Take 10 mg by mouth daily.    [provider]  sertraline (ZOLOFT) 100 MG tablet TAKE 2.5 TABLETS (250 MG TOTAL) BY MOUTH DAILY. 08/12/13   Puthuvel, Iven FinnShaji J, MD    Family History Family History  Problem Relation Age of Onset  . Cancer Mother   . Diabetes Mellitus II Mother   . Hypertension Mother   . Stroke Mother   . Kidney failure Father   . Stroke Maternal Aunt   . Kidney failure Maternal Uncle   . Stroke Maternal Uncle     Social History Social History   Tobacco Use  . Smoking status: Never  Smoker  . Smokeless tobacco: Never Used  Substance Use Topics  . Alcohol use: No  . Drug use: No     Allergies   Topamax [topiramate]   Review of Systems Review of Systems No sore throat + cough No pleuritic pain, but feels tight in anterior chest. No wheezing + nasal congestion ? post-nasal drainage No sinus pain/pressure No itchy/red eyes No earache No hemoptysis + SOB No fever, + chills/sweats No nausea No vomiting No abdominal pain No diarrhea No urinary symptoms No skin rash + fatigue + myalgias + headache Used OTC meds without relief   Physical Exam Triage Vital Signs ED Triage Vitals  Enc Vitals Group     BP 01/29/19 1356 125/89     Pulse Rate 01/29/19 1356 82     Resp --      Temp 01/29/19 1356 98.1 F (36.7 C)     Temp Source 01/29/19 1356 Oral     SpO2 01/29/19 1356 95 %     Weight 01/29/19 1358 200 lb (90.7 kg)     Height --       Head Circumference --      Peak Flow --      Pain Score 01/29/19 1358 0     Pain Loc --      Pain Edu? --      Excl. in [place]? --    No data found.  Updated Vital Signs BP 125/89 (BP Location: Right Arm)   Pulse 82   Temp 98.1 F (36.7 C) (Oral)   Wt 90.7 kg   SpO2 95%   BMI 35.43 kg/m   Visual Acuity Right Eye Distance:   Left Eye Distance:   Bilateral Distance:    Right Eye Near:   Left Eye Near:    Bilateral Near:     Physical Exam Nursing notes and Vital Signs reviewed. Appearance:  Patient appears stated age, and in no acute distress Eyes:  Pupils are equal, round, and reactive to light and accomodation.  Extraocular movement is intact.  Conjunctivae are not inflamed  Nose:  Normal turbinates.  No sinus tenderness.   Pharynx:  Normal; moist mucous membranes  Neck:  Supple.  Enlarged posterior/lateral nodes are palpated bilaterally, tender to palpation on the left.   Lungs:  Clear to auscultation.  Breath sounds are equal.  Moving air well. Heart:  Regular rate and rhythm without murmurs, rubs, or gallops.  Abdomen:  Nontender without masses or hepatosplenomegaly.  Bowel sounds are present.  No CVA or flank tenderness.  Extremities:  No edema.  Skin:  No rash present.    UC Treatments / Results  Labs (all labs ordered are listed, but only abnormal results are displayed) Labs Reviewed - No data to display  EKG   Radiology Dg Chest 2 View  Result Date: 01/29/2019 CLINICAL DATA:  Productive cough. EXAM: CHEST - 2 VIEW COMPARISON:  None. FINDINGS: The heart size and mediastinal contours are within normal limits. Both lungs are clear except for slight peribronchial thickening. The visualized skeletal structures are unremarkable. IMPRESSION: Slight bronchitic changes. Electronically Signed   By: Lorriane Shire M.D.   On: 01/29/2019 14:59    Procedures Procedures (including critical care time)  Medications Ordered in UC Medications - No data to display   Initial Impression / Assessment and Plan / UC Course  I have reviewed the triage vital signs and the nursing notes.  Pertinent labs & imaging results that were available during my care of  the patient were reviewed by me and considered in my medical decision making (see chart for details).    Begin doxycycline 100mg  BID for one week. Prescription written for Benzonatate [facility]) to take at bedtime for night-time cough.  Followup with Family Doctor if not improved in one week.    Final Clinical Impressions(s) / UC Diagnoses   Final diagnoses:  Bronchitis     Discharge Instructions     Take plain guaifenesin (1200mg  extended release tabs such as Mucinex) twice daily, with plenty of water, for cough and congestion. Get adequate rest.  For nasal congestion, recommend using saline nasal spray several times daily and saline nasal irrigation (AYR is a common brand).    Try warm salt water gargles for sore throat.  Stop all antihistamines for now, and other non-prescription cough/cold preparations. If symptoms become significantly worse during the night or over the weekend, proceed to the local emergency room.     ED Prescriptions    Medication Sig Dispense Auth. Provider   doxycycline (VIBRAMYCIN) 100 MG capsule Take 1 capsule (100 mg total) by mouth 2 (two) times daily. Take with food. 14 capsule Lattie Haw, MD   benzonatate (TESSALON) 200 MG capsule Take one cap by mouth at bedtime as needed for cough.  May repeat in 4 to 6 hours 15 capsule Cathren Harsh Tera Mater, MD         Lattie Haw, MD 01/29/19 [phone]

## 2019-01-29 NOTE — Discharge Instructions (Addendum)
Take plain guaifenesin (1200mg  extended release tabs such as Mucinex) twice daily, with plenty of water, for cough and congestion. Get adequate rest.  For nasal congestion, recommend using saline nasal spray several times daily and saline nasal irrigation (AYR is a common brand).    Try warm salt water gargles for sore throat.  Stop all antihistamines for now, and other non-prescription cough/cold preparations. If symptoms become significantly worse during the night or over the weekend, proceed to the local emergency room.

## 2019-05-02 ENCOUNTER — Other Ambulatory Visit: Payer: Self-pay

## 2019-05-02 ENCOUNTER — Encounter: Payer: Self-pay | Admitting: Emergency Medicine

## 2019-05-02 ENCOUNTER — Emergency Department (INDEPENDENT_AMBULATORY_CARE_PROVIDER_SITE_OTHER)
Admission: EM | Admit: 2019-05-02 | Discharge: 2019-05-02 | Disposition: A | Discharge status: Home / Self Care | Payer: Medicare Other | Source: Home / Self Care

## 2019-05-02 ENCOUNTER — Emergency Department (INDEPENDENT_AMBULATORY_CARE_PROVIDER_SITE_OTHER): Payer: Medicare Other

## 2019-05-02 DIAGNOSIS — M25512 Pain in left shoulder: Secondary | ICD-10-CM

## 2019-05-02 DIAGNOSIS — M25572 Pain in left ankle and joints of left foot: Secondary | ICD-10-CM | POA: Diagnosis not present

## 2019-05-02 DIAGNOSIS — S93402A Sprain of unspecified ligament of left ankle, initial encounter: Secondary | ICD-10-CM

## 2019-05-02 NOTE — ED Provider Notes (Signed)
[facility]    CSN: [id number] Arrival date & time: 05/02/19  1529      History   Chief Complaint Chief Complaint  Patient presents with  . Ankle Pain    HPI Nichole Parker is a 58 y.o. female.   The history is provided by the patient. No language interpreter was used.  Ankle Pain Location:  Ankle Injury: no   Ankle location:  L ankle Pain details:    Quality:  Aching   Radiates to:  Does not radiate   Severity:  Moderate   Onset quality:  Gradual   Timing:  Constant   Progression:  Worsening Chronicity:  New Dislocation: no   Relieved by:  Nothing Worsened by:  Nothing Ineffective treatments:  None tried Risk factors: no recent illness   Pt fell and hit her left shoulder and injured her left ankle.   Past Medical History:  Diagnosis Date  . Arthritis    Bilateral knees.  . Baker's cyst of knee 03/2012   Right Knee  . Diabetes mellitus, type II (HCC)   . 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 RSD (reflex sympathetic dystrophy)   . Stroke (HCC) 10/2011   Had several strokes.  . Stroke [facility]) 04/2012    Patient Active Problem List   Diagnosis Date Noted  . Major depressive disorder, recurrent episode, severe (HCC) 02/07/2012    Past Surgical History:  Procedure Laterality Date  . ANKLE SURGERY    . FOOT SURGERY    . KNEE SURGERY      OB History   No obstetric history on file.      Home Medications    Prior to Admission medications   Medication Sig Start Date End Date Taking? Authorizing Provider  folic acid (FOLVITE) 1 MG tablet Take by mouth. 04/04/19  Yes [provider]  aspirin 81 MG tablet Take 81 mg by mouth daily.    [provider]  baclofen (LIORESAL) 10 MG tablet Take 20 mg by mouth 4 (four) times daily.     [provider]  benzonatate (TESSALON) 200 MG capsule Take one cap by mouth at bedtime as needed for cough.  May repeat in 4 to 6 hours 01/29/19   Lattie Haw, MD  clopidogrel (PLAVIX) 75 MG  tablet Take 75 mg by mouth daily.    [provider]  doxycycline (VIBRAMYCIN) 100 MG capsule Take 1 capsule (100 mg total) by mouth 2 (two) times daily. Take with food. 01/29/19   Lattie Haw, MD  DULoxetine (CYMBALTA) 30 MG capsule Take 30 mg by mouth at bedtime. 02/12/19   [provider]  fentaNYL (DURAGESIC - DOSED MCG/HR) 75 MCG/HR Place 1 patch onto the skin every 3 (three) days.    Burtis Junes, MD  gabapentin (NEURONTIN) 800 MG tablet Take 800 mg by mouth 4 (four) times daily.     [provider]  HYDROmorphone (DILAUDID) 2 MG tablet Take 8 mg by mouth every 4 (four) hours as needed.     [provider]  rosuvastatin (CRESTOR) 10 MG tablet Take 10 mg by mouth daily.    [provider]  sertraline (ZOLOFT) 100 MG tablet TAKE 2.5 TABLETS (250 MG TOTAL) BY MOUTH DAILY. 08/12/13   Puthuvel, Iven Finn, MD    Family History Family History  Problem Relation Age of Onset  . Cancer Mother   . Diabetes Mellitus II Mother   . Hypertension Mother   . Stroke Mother   .  Kidney failure Father   . Stroke Maternal Aunt   . Kidney failure Maternal Uncle   . Stroke Maternal Uncle     Social History Social History   Tobacco Use  . Smoking status: Never Smoker  . Smokeless tobacco: Never Used  Substance Use Topics  . Alcohol use: No  . Drug use: No     Allergies   Topamax [topiramate]   Review of Systems Review of Systems  All other systems reviewed and are negative.    Physical Exam Triage Vital Signs ED Triage Vitals  Enc Vitals Group     BP 05/02/19 1628 134/85     Pulse Rate 05/02/19 1628 84     Resp --      Temp --      Temp src --      SpO2 05/02/19 1628 98 %     Weight 05/02/19 1629 200 lb (90.7 kg)     Height 05/02/19 1629 5\' 3"  (1.6 m)     Head Circumference --      Peak Flow --      Pain Score 05/02/19 1629 10     Pain Loc --      Pain Edu? --      Excl. in House? --    No data found.  Updated Vital Signs BP  134/85 (BP Location: Right Arm)   Pulse 84   Ht 5\' 3"  (1.6 m)   Wt 90.7 kg   SpO2 98%   BMI 35.43 kg/m   Visual Acuity Right Eye Distance:   Left Eye Distance:   Bilateral Distance:    Right Eye Near:   Left Eye Near:    Bilateral Near:     Physical Exam Vitals and nursing note reviewed.  Constitutional:      Appearance: She is well-developed.  HENT:     Head: Normocephalic.  Pulmonary:     Effort: Pulmonary effort is normal.  Abdominal:     General: There is no distension.  Musculoskeletal:        General: Swelling present. Normal range of motion.     Cervical back: Normal range of motion.     Comments: Tender left ankle pain with movement,  Tender right shoulder,  From   Neurological:     Mental Status: She is alert and oriented to person, place, and time.      UC Treatments / Results  Labs (all labs ordered are listed, but only abnormal results are displayed) Labs Reviewed - No data to display  EKG   Radiology DG Ankle Complete Left  Result Date: 05/02/2019 CLINICAL DATA:  Recent fall yesterday with left ankle pain, initial encounter EXAM: LEFT ANKLE COMPLETE - 3+ VIEW COMPARISON:  None. FINDINGS: There is no evidence of fracture, dislocation, or joint effusion. There is no evidence of arthropathy or other focal bone abnormality. Soft tissues are unremarkable. IMPRESSION: No acute abnormality noted. Electronically Signed   By: Inez Catalina M.D.   On: 05/02/2019 16:58   DG Shoulder Left  Result Date: 05/02/2019 CLINICAL DATA:  Pt states she fell of her porch last night and injured her left shoulder. C/o lateral pain with ROM EXAM: LEFT SHOULDER - 2+ VIEW COMPARISON:  None. FINDINGS: There is no evidence of fracture or dislocation. There is no evidence of arthropathy or other focal bone abnormality. Soft tissues are unremarkable. IMPRESSION: No acute osseous abnormality in the left shoulder. Electronically Signed   By: Jac Canavan.D.  On: 05/02/2019  17:00    Procedures Procedures (including critical care time)  Medications Ordered in UC Medications - No data to display  Initial Impression / Assessment and Plan / UC Course  I have reviewed the triage vital signs and the nursing notes.  Pertinent labs & imaging results that were available during my care of the patient were reviewed by me and considered in my medical decision making (see chart for details).     MDM  Xrays reviewed no fx.  Pt advised ice, rest, follow up with Md for recheck if pain persist  Final Clinical Impressions(s) / UC Diagnoses   Final diagnoses:  Acute left ankle pain  Acute pain of left shoulder  Sprain of left ankle, unspecified ligament, initial encounter     Discharge Instructions     Ice to area of swelling.  Return if any problems.    ED Prescriptions    None     PDMP not reviewed this encounter.  An After Visit Summary was printed and given to the patient.    Elson AreasSofia, Antwion Carpenter K, [place] JerseyPA-C 05/03/19 Paulo [id number]

## 2019-05-02 NOTE — ED Triage Notes (Signed)
Patient states that she fell off of her porch last night and landed on her right side, hit her head.  Patient is having left sided shoulder/arm, left ankle/foot pain.

## 2019-05-02 NOTE — Discharge Instructions (Signed)
Ice to area of swelling.  Return if any problems.   

## 2021-08-01 IMAGING — DX DG SHOULDER 2+V*L*
3 series · 3 of 3 positions shown · non-contrast
Comparison: None.

CLINICAL DATA: Pt states she fell of her porch last night and
injured her left shoulder. C/o lateral pain with ROM

EXAM:
LEFT SHOULDER - 2+ VIEW

[shoulder grashey]
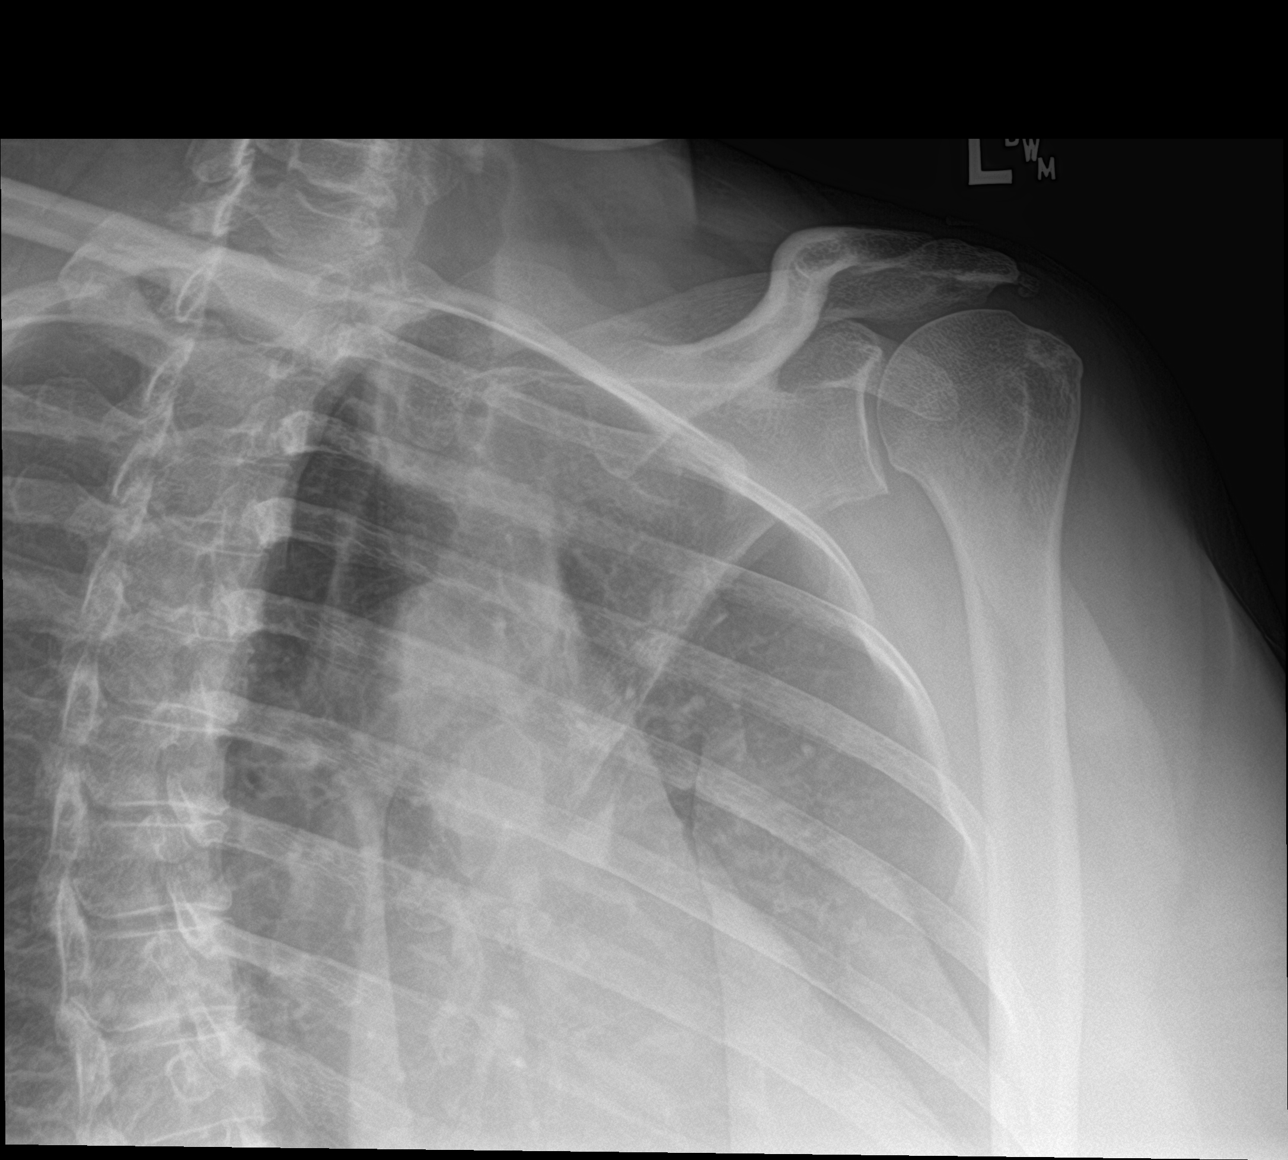

[shoulder axillary]
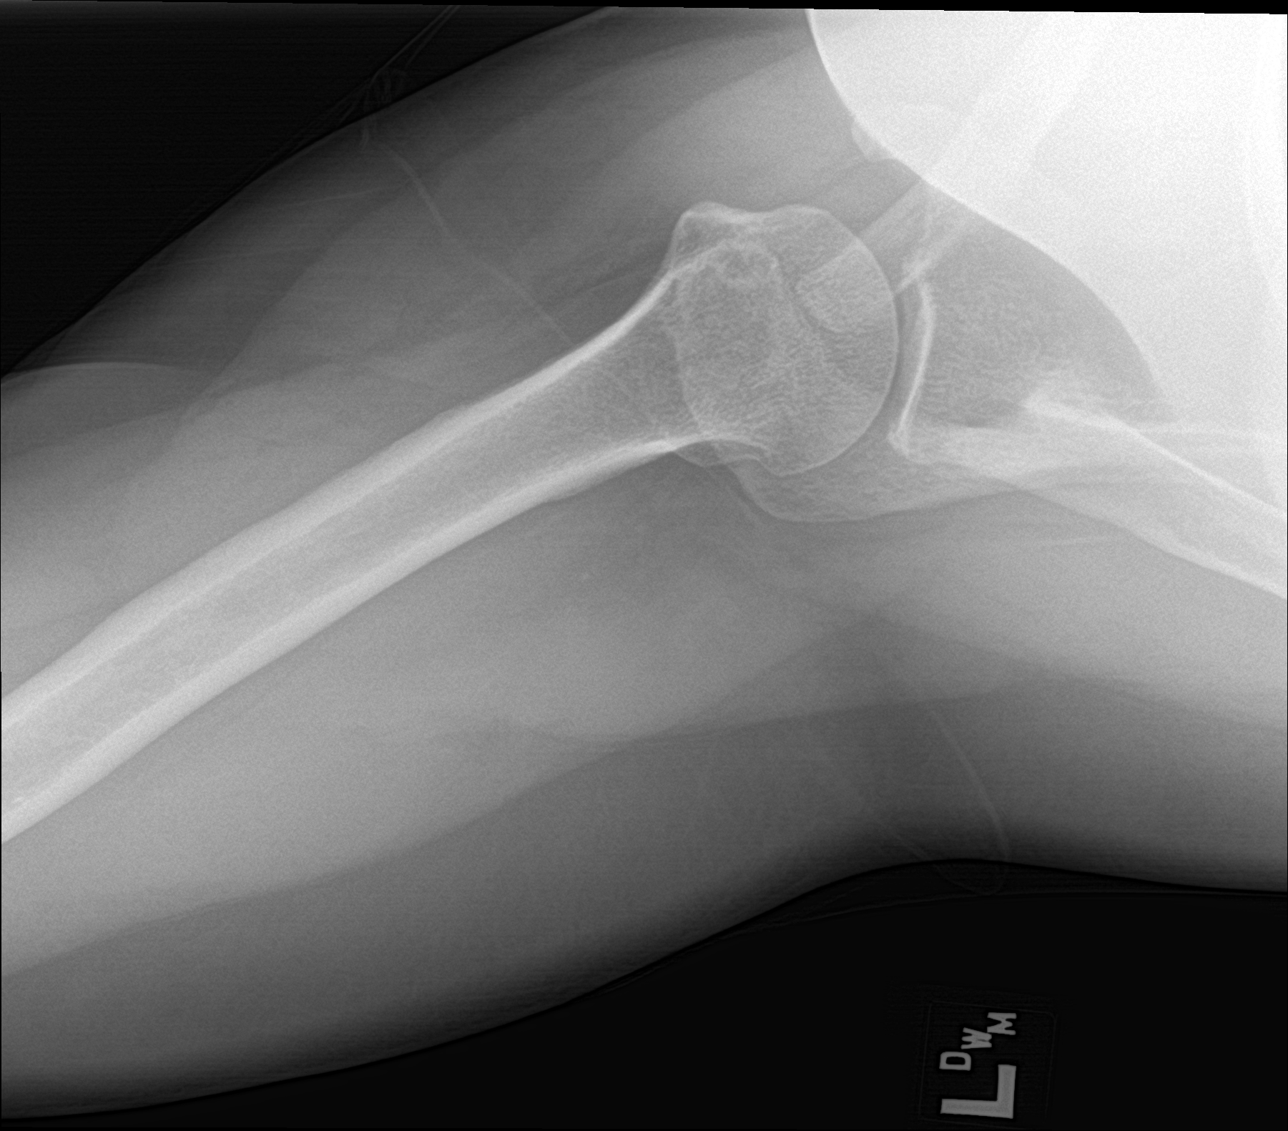

[shoulder y view]
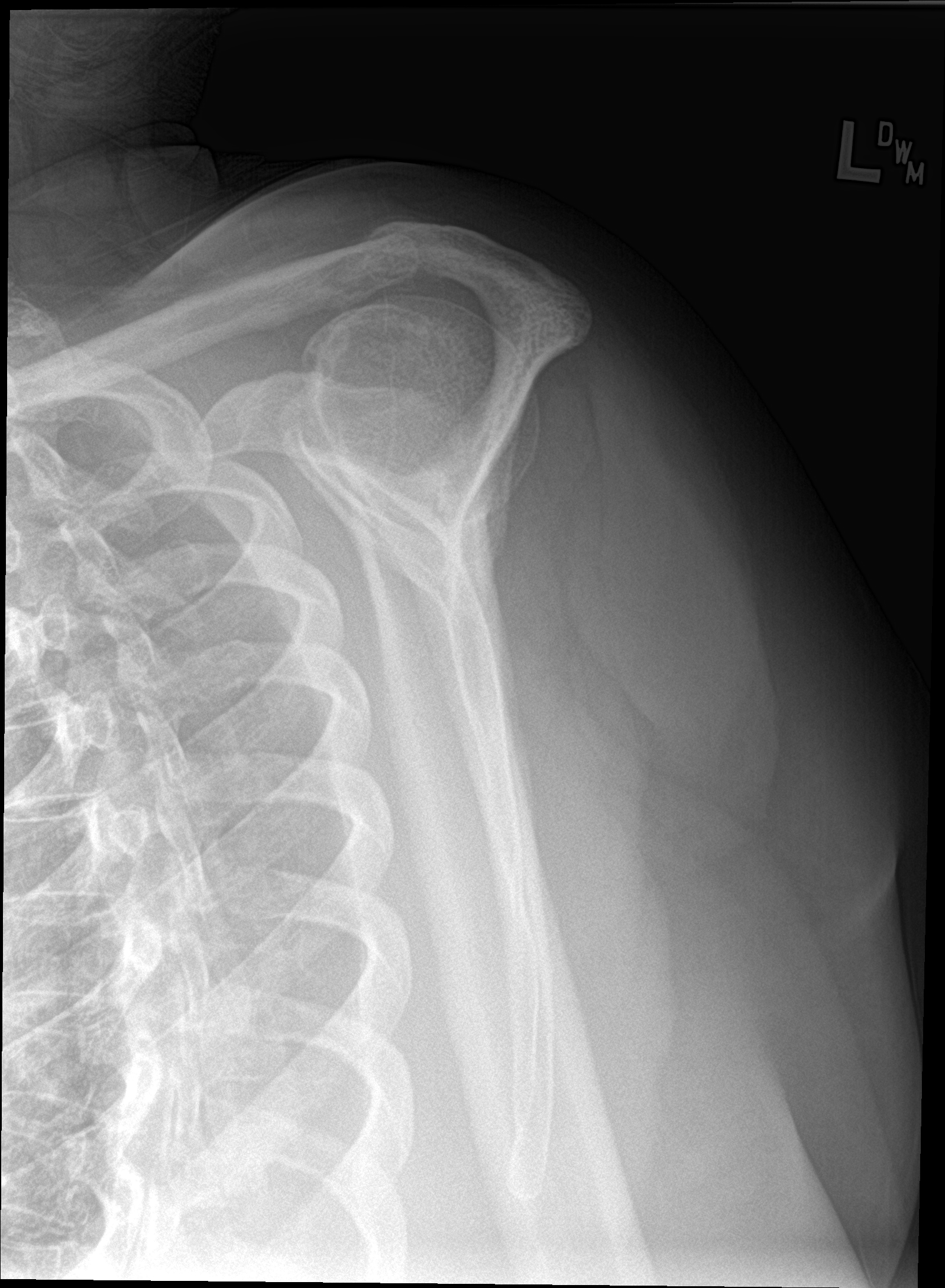

[3 of 3 positions shown; findings below may reference images not displayed]

FINDINGS: There is no evidence of fracture or dislocation. There is no
evidence of arthropathy or other focal bone abnormality. Soft
tissues are unremarkable.
IMPRESSION: No acute osseous abnormality in the left shoulder.

## 2023-08-20 ENCOUNTER — Other Ambulatory Visit (HOSPITAL_BASED_OUTPATIENT_CLINIC_OR_DEPARTMENT_OTHER): Payer: Self-pay

## 2023-08-20 ENCOUNTER — Other Ambulatory Visit (HOSPITAL_COMMUNITY): Payer: Self-pay

## 2023-08-21 ENCOUNTER — Other Ambulatory Visit (HOSPITAL_BASED_OUTPATIENT_CLINIC_OR_DEPARTMENT_OTHER): Payer: Self-pay

## 2023-08-25 ENCOUNTER — Other Ambulatory Visit (HOSPITAL_BASED_OUTPATIENT_CLINIC_OR_DEPARTMENT_OTHER): Payer: Self-pay

## 2023-08-25 MED ORDER — HYDROMORPHONE HCL 8 MG PO TABS
8.0000 mg | ORAL_TABLET | Freq: Three times a day (TID) | ORAL | 0 refills | Status: AC | PRN
  Filled 2023-08-25: qty 90, 30d supply, fill #0

## 2023-08-25 MED ORDER — HYDROMORPHONE HCL 8 MG PO TABS
8.0000 mg | ORAL_TABLET | Freq: Three times a day (TID) | ORAL | 0 refills | Status: AC | PRN
  Filled 2023-09-24: qty 90, 30d supply, fill #0

## 2023-09-24 ENCOUNTER — Other Ambulatory Visit (HOSPITAL_BASED_OUTPATIENT_CLINIC_OR_DEPARTMENT_OTHER): Payer: Self-pay

## 2023-10-20 ENCOUNTER — Other Ambulatory Visit (HOSPITAL_BASED_OUTPATIENT_CLINIC_OR_DEPARTMENT_OTHER): Payer: Self-pay

## 2023-10-20 MED ORDER — HYDROMORPHONE HCL 8 MG PO TABS
8.0000 mg | ORAL_TABLET | Freq: Three times a day (TID) | ORAL | 0 refills | Status: DC | PRN
  Filled 2023-10-27: qty 90, 30d supply, fill #0

## 2023-10-27 ENCOUNTER — Other Ambulatory Visit (HOSPITAL_BASED_OUTPATIENT_CLINIC_OR_DEPARTMENT_OTHER): Payer: Self-pay

## 2023-11-24 ENCOUNTER — Other Ambulatory Visit (HOSPITAL_BASED_OUTPATIENT_CLINIC_OR_DEPARTMENT_OTHER): Payer: Self-pay

## 2023-11-25 ENCOUNTER — Other Ambulatory Visit (HOSPITAL_BASED_OUTPATIENT_CLINIC_OR_DEPARTMENT_OTHER): Payer: Self-pay

## 2023-11-25 MED ORDER — HYDROMORPHONE HCL 8 MG PO TABS
8.0000 mg | ORAL_TABLET | Freq: Three times a day (TID) | ORAL | 0 refills | Status: AC | PRN
  Filled 2023-11-26: qty 90, 30d supply, fill #0

## 2023-11-26 ENCOUNTER — Other Ambulatory Visit (HOSPITAL_BASED_OUTPATIENT_CLINIC_OR_DEPARTMENT_OTHER): Payer: Self-pay

## 2023-12-22 ENCOUNTER — Other Ambulatory Visit (HOSPITAL_BASED_OUTPATIENT_CLINIC_OR_DEPARTMENT_OTHER): Payer: Self-pay

## 2023-12-22 MED ORDER — HYDROMORPHONE HCL 8 MG PO TABS
8.0000 mg | ORAL_TABLET | Freq: Three times a day (TID) | ORAL | 0 refills | Status: AC | PRN
  Filled 2023-12-26: qty 90, 30d supply, fill #0

## 2023-12-22 MED ORDER — HYDROMORPHONE HCL 8 MG PO TABS
8.0000 mg | ORAL_TABLET | Freq: Three times a day (TID) | ORAL | 0 refills | Status: AC | PRN
  Filled 2024-01-26: qty 90, 30d supply, fill #0

## 2023-12-26 ENCOUNTER — Other Ambulatory Visit (HOSPITAL_BASED_OUTPATIENT_CLINIC_OR_DEPARTMENT_OTHER): Payer: Self-pay

## 2024-01-26 ENCOUNTER — Other Ambulatory Visit (HOSPITAL_BASED_OUTPATIENT_CLINIC_OR_DEPARTMENT_OTHER): Payer: Self-pay

## 2024-01-27 ENCOUNTER — Other Ambulatory Visit (HOSPITAL_BASED_OUTPATIENT_CLINIC_OR_DEPARTMENT_OTHER): Payer: Self-pay

## 2024-02-09 ENCOUNTER — Other Ambulatory Visit (HOSPITAL_BASED_OUTPATIENT_CLINIC_OR_DEPARTMENT_OTHER): Payer: Self-pay

## 2024-02-09 MED ORDER — HYDROMORPHONE HCL 8 MG PO TABS
8.0000 mg | ORAL_TABLET | Freq: Three times a day (TID) | ORAL | 0 refills | Status: AC
  Filled 2024-03-26: qty 90, 30d supply, fill #0

## 2024-02-09 MED ORDER — HYDROMORPHONE HCL 8 MG PO TABS
8.0000 mg | ORAL_TABLET | Freq: Three times a day (TID) | ORAL | 0 refills | Status: AC
  Filled 2024-02-25: qty 90, 30d supply, fill #0

## 2024-02-25 ENCOUNTER — Other Ambulatory Visit (HOSPITAL_BASED_OUTPATIENT_CLINIC_OR_DEPARTMENT_OTHER): Payer: Self-pay

## 2024-03-26 ENCOUNTER — Other Ambulatory Visit (HOSPITAL_BASED_OUTPATIENT_CLINIC_OR_DEPARTMENT_OTHER): Payer: Self-pay

## 2024-04-08 ENCOUNTER — Other Ambulatory Visit (HOSPITAL_BASED_OUTPATIENT_CLINIC_OR_DEPARTMENT_OTHER): Payer: Self-pay

## 2024-04-08 MED ORDER — HYDROMORPHONE HCL 8 MG PO TABS
8.0000 mg | ORAL_TABLET | Freq: Three times a day (TID) | ORAL | 0 refills | Status: AC | PRN
  Filled 2024-04-26: qty 90, 30d supply, fill #0

## 2024-04-08 MED ORDER — HYDROMORPHONE HCL 8 MG PO TABS
8.0000 mg | ORAL_TABLET | Freq: Three times a day (TID) | ORAL | 0 refills | Status: AC
  Filled 2024-05-26 – 2024-05-27 (×2): qty 90, 30d supply, fill #0

## 2024-04-26 ENCOUNTER — Other Ambulatory Visit (HOSPITAL_BASED_OUTPATIENT_CLINIC_OR_DEPARTMENT_OTHER): Payer: Self-pay

## 2024-05-26 ENCOUNTER — Other Ambulatory Visit (HOSPITAL_BASED_OUTPATIENT_CLINIC_OR_DEPARTMENT_OTHER): Payer: Self-pay

## 2024-05-27 ENCOUNTER — Other Ambulatory Visit (HOSPITAL_BASED_OUTPATIENT_CLINIC_OR_DEPARTMENT_OTHER): Payer: Self-pay

## 2024-05-31 ENCOUNTER — Other Ambulatory Visit: Payer: Self-pay

## 2024-06-01 ENCOUNTER — Other Ambulatory Visit (HOSPITAL_BASED_OUTPATIENT_CLINIC_OR_DEPARTMENT_OTHER): Payer: Self-pay
